# Patient Record
Sex: Male | Born: 1980 | Race: White | Hispanic: No | Marital: Single
Health system: Southern US, Community
[De-identification: ages and names within clinical notes are randomized; demographics above are authoritative.]

---

## 2016-08-21 ENCOUNTER — Ambulatory Visit (INDEPENDENT_AMBULATORY_CARE_PROVIDER_SITE_OTHER): Payer: Managed Care, Other (non HMO) | Admitting: Family

## 2016-08-21 DIAGNOSIS — M5442 Lumbago with sciatica, left side: Secondary | ICD-10-CM | POA: Diagnosis not present

## 2016-08-21 DIAGNOSIS — G8929 Other chronic pain: Secondary | ICD-10-CM

## 2016-08-21 DIAGNOSIS — M5441 Lumbago with sciatica, right side: Secondary | ICD-10-CM | POA: Diagnosis not present

## 2016-08-21 MED ORDER — PREDNISONE 10 MG PO TABS: ORAL_TABLET | ORAL | 0 refills | Status: DC

## 2016-08-22 NOTE — Progress Notes (Signed)
Office Visit Note   [name]           Date of Birth: Jan 21, 1981           MRN: [id number] Visit Date: 08/21/2016              Requested by: No referring provider defined for this encounter. PCP: No PCP Per Patient  No chief complaint on file.     HPI: The patient is a 36 year old gentleman seen today for evaluation of low back pain. This is been ongoing for 7 years intermittently. States has been worsening over the last 3 weeks. He has had constant radicular symptoms down the right side and at times his left lower extremity. States that when the pain initially started he simply sneezed while at rest has had issues of back pain ever since. Has been evaluated in multiple urgent cares and emergency department for the same. Most recently has seen a chiropractor who did x-rays states that the visit with the chiropractor was not helpful. Has used Aleve when necessary "just to be taking something" but this has not been helpful. Complains of right sided pain radiating all the way down to his foot today. Denies any numbness or tingling so seated with this. Has least pain when he is laying flat on his back. States that he also leans forward on his elbows to offload pressure on his spine.  Historical Radiographs reveal loss disc space l4 l5 and l5- s1. No spondylolisthesis.  Assessment & Plan: Visit Diagnoses:  1. Chronic bilateral low back pain with bilateral sciatica     Plan: Will trial a prednisone taper. Has had relief from these in the past. Will proceed with MRI lumbar spine to rule out HNP.   Follow-Up Instructions: Return for p mri.   Back Exam   Tenderness  The patient is experiencing no tenderness.   Muscle Strength  The patient has normal back strength.  Tests  Straight leg raise right: positive Straight leg raise left: negative      Patient is alert, oriented, no adenopathy, well-dressed, normal affect, normal respiratory effort. Uncomfortable on  exam.  Imaging: No results found.  Labs: No results found for: HGBA1C, ESRSEDRATE, CRP, LABURIC, REPTSTATUS, GRAMSTAIN, CULT, LABORGA  Orders:  Orders Placed This Encounter  Procedures  . MR LUMBAR SPINE WO CONTRAST   Meds ordered this encounter  Medications  . predniSONE (DELTASONE) 10 MG tablet    Sig: 6 tablets for 2 days, then 5 for 2 days, then 4 for 2 days, then 3  for 2 days, then 2 for 2 days, then 1 tablet for 2 days    Dispense:  42 tablet    Refill:  0     Procedures: No procedures performed  Clinical Data: No additional findings.  ROS:  All other systems negative, except as noted in the HPI. Review of Systems  Constitutional: Negative for chills and fever.  Gastrointestinal: Negative.   Genitourinary: Negative.   Musculoskeletal: Positive for back pain.  Neurological: Negative for weakness and numbness.    Objective: Vital Signs: There were no vitals taken for this visit.  Specialty Comments:  No specialty comments available.  PMFS History: There are no active problems to display for this patient.  No past medical history on file.  No family history on file.  No past surgical history on file. Social History   Occupational History  . Not on file.   Social History Main Topics  .  Smoking status: Not on file  . Smokeless tobacco: Not on file  . Alcohol use Not on file  . Drug use: Unknown  . Sexual activity: Not on file

## 2016-08-31 ENCOUNTER — Ambulatory Visit (HOSPITAL_BASED_OUTPATIENT_CLINIC_OR_DEPARTMENT_OTHER)
Admission: RE | Admit: 2016-08-31 | Discharge: 2016-08-31 | Disposition: A | Discharge status: Home / Self Care | Payer: Managed Care, Other (non HMO) | Source: Ambulatory Visit | Attending: Family | Admitting: Family

## 2016-08-31 DIAGNOSIS — G8929 Other chronic pain: Secondary | ICD-10-CM | POA: Insufficient documentation

## 2016-08-31 DIAGNOSIS — M5441 Lumbago with sciatica, right side: Secondary | ICD-10-CM | POA: Insufficient documentation

## 2016-08-31 DIAGNOSIS — M5442 Lumbago with sciatica, left side: Secondary | ICD-10-CM | POA: Insufficient documentation

## 2016-08-31 DIAGNOSIS — M48061 Spinal stenosis, lumbar region without neurogenic claudication: Secondary | ICD-10-CM | POA: Diagnosis not present

## 2016-08-31 DIAGNOSIS — M5126 Other intervertebral disc displacement, lumbar region: Secondary | ICD-10-CM | POA: Diagnosis not present

## 2016-09-02 ENCOUNTER — Other Ambulatory Visit (INDEPENDENT_AMBULATORY_CARE_PROVIDER_SITE_OTHER): Payer: Self-pay | Admitting: Family

## 2016-09-02 DIAGNOSIS — G8929 Other chronic pain: Secondary | ICD-10-CM

## 2016-09-02 DIAGNOSIS — M5442 Lumbago with sciatica, left side: Principal | ICD-10-CM

## 2016-09-03 ENCOUNTER — Telehealth (INDEPENDENT_AMBULATORY_CARE_PROVIDER_SITE_OTHER): Payer: Self-pay

## 2016-09-03 ENCOUNTER — Ambulatory Visit (INDEPENDENT_AMBULATORY_CARE_PROVIDER_SITE_OTHER): Payer: Managed Care, Other (non HMO) | Admitting: Orthopedic Surgery

## 2016-09-03 ENCOUNTER — Other Ambulatory Visit (INDEPENDENT_AMBULATORY_CARE_PROVIDER_SITE_OTHER): Payer: Self-pay | Admitting: Family

## 2016-09-03 MED ORDER — PREDNISONE 10 MG PO TABS: ORAL_TABLET | ORAL | 0 refills | Status: AC

## 2016-09-03 NOTE — Telephone Encounter (Signed)
[facility] wish to refill 

## 2016-09-03 NOTE — Telephone Encounter (Signed)
Prescription refilled for the prednisone. Follow-up with Dr. Lajoyce Corners for evaluation for epidural steroid injection.

## 2016-09-03 NOTE — Telephone Encounter (Signed)
Pt called again to ask if this can be filled today due to him going out of town tomorrow (Prednisone)

## 2016-09-03 NOTE — Telephone Encounter (Signed)
Called pt to schedule injection with Dr. Alvester Morin. Scheduled him for 09/12/16 (1st available). Canc his appt today with Dr. Lajoyce Corners for the MRI review like we discussed and then he asked about pain med. Says he will finish the prednisone tomorrow which has been helping and wanted to know if this could be refilled or if he could get something else to help him until he is able to be seen next week? Uses [facility] [address] [facility]

## 2016-09-03 NOTE — Telephone Encounter (Signed)
Please see message below and advise. Ok to refill prednisone or something else for pain.

## 2016-09-04 NOTE — Telephone Encounter (Signed)
I called and advised pt

## 2016-09-12 ENCOUNTER — Encounter (INDEPENDENT_AMBULATORY_CARE_PROVIDER_SITE_OTHER): Payer: Managed Care, Other (non HMO) | Admitting: Physical Medicine and Rehabilitation

## 2016-09-16 ENCOUNTER — Ambulatory Visit (INDEPENDENT_AMBULATORY_CARE_PROVIDER_SITE_OTHER): Payer: Managed Care, Other (non HMO) | Admitting: Physical Medicine and Rehabilitation

## 2016-09-16 ENCOUNTER — Ambulatory Visit (INDEPENDENT_AMBULATORY_CARE_PROVIDER_SITE_OTHER): Payer: Self-pay

## 2016-09-16 VITALS — BP 130/84 | HR 97

## 2016-09-16 DIAGNOSIS — M5416 Radiculopathy, lumbar region: Secondary | ICD-10-CM | POA: Diagnosis not present

## 2016-09-16 DIAGNOSIS — M5116 Intervertebral disc disorders with radiculopathy, lumbar region: Secondary | ICD-10-CM

## 2016-09-16 MED ORDER — IIOPAMIDOL (ISOVUE-250) INJECTION 51%
3.0000 mL | Freq: Once | INTRAVENOUS | Status: AC
  Administered 2016-09-16: 3 mL
  Filled 2016-09-16: qty 50

## 2016-09-16 MED ORDER — METHYLPREDNISOLONE ACETATE 80 MG/ML IJ SUSP
80.0000 mg | Freq: Once | INTRAMUSCULAR | Status: AC
  Administered 2016-09-16: 80 mg

## 2016-09-16 MED ORDER — LIDOCAINE HCL (PF) 1 % IJ SOLN
2.0000 mL | Freq: Once | INTRAMUSCULAR | Status: AC
  Administered 2016-09-16: 2 mL

## 2016-09-16 NOTE — Progress Notes (Deleted)
Pain center of low back. Shooting pain down both legs at times. Not related to any certain activity. Denies numbness, tingling, or weakness.

## 2016-09-16 NOTE — Progress Notes (Signed)
Tyler Mcdonald - 36 y.o. male MRN [id number]  Date of birth: 1981-02-11  Office Visit Note: Visit Date: 09/16/2016 PCP: Patient, No Pcp Per Referred by: Adonis Huguenin, NP  Subjective: Chief Complaint  Patient presents with  . Lower Back - Pain   HPI: [name] is a very pleasant 36 year old gentleman who has been suffering from on off intermittent flareups of low back pain and radicular-type hip and thigh pain. He has a MRI of the lumbar spine showing broad-based central protrusion at L4-5 with left more than right lateral recess narrowing. There is no focal nerve compression. He has had conservative care for many years but has not had interventional injection. This particular episode is been pretty problematic and painful and this is not going away. He first had symptoms probably 7 or 8 years ago. He said he had a sneeze at that point and has had really severe low back pain off and on since that time in episodes. He denies any focal weakness.    ROS Otherwise per HPI.  Assessment & Plan: Visit Diagnoses:  1. Lumbar radiculopathy   2. Radiculopathy due to lumbar intervertebral disc disorder     Plan: Findings:  Bilateral L5 transforaminal injections to try to get steroid medication along the lateral recess and right at the L4-5 disc interface anteriorly. This would hope would be diagnostic and therapeutic. If he is having more back pain chronically he should still focus on core strengthening and endurance with flexibility and mobility exercises for his hip and hamstrings. Also the L5 vertebral bodies seem to be partially sacralized particularly on the left side.    Meds & Orders:  Meds ordered this encounter  Medications  . lidocaine (PF) (XYLOCAINE) 1 % injection 2 mL  . iopamidol (ISOVUE-250) 51 % injection 3 mL  . methylPREDNISolone acetate (DEPO-MEDROL) injection 80 mg    Orders Placed This Encounter  Procedures  . XR C-ARM NO REPORT  . Epidural Steroid injection      Follow-up: Return if symptoms worsen or fail to improve.   Procedures: No procedures performed  Lumbosacral Transforaminal Epidural Steroid Injection - Infraneural Approach with Fluoroscopic Guidance  [name]      Date of Birth: 02/23/1981 MRN: [id number] PCP: Patient, No Pcp Per      Visit Date: 09/16/2016   Universal Protocol:    Date/Time: 05/08/186:05 AM  Consent Given By: the patient  Position: PRONE   Additional Comments: Vital signs were monitored before and after the procedure. Patient was prepped and draped in the usual sterile fashion. The correct patient, procedure, and site was verified.   Injection Procedure Details:  Procedure Site One Meds Administered:  Meds ordered this encounter  Medications  . lidocaine (PF) (XYLOCAINE) 1 % injection 2 mL  . iopamidol (ISOVUE-250) 51 % injection 3 mL  . methylPREDNISolone acetate (DEPO-MEDROL) injection 80 mg      Laterality: Bilateral  Location/Site:  L5-S1  Needle size: 22 G  Needle type: Spinal  Needle Placement: Transforaminal  Findings:  -Contrast Used: 1 mL iohexol 180 mg iodine/mL   -Comments: Excellent flow of contrast along the nerve and into the epidural space.  Procedure Details: After squaring off the end-plates of the desired vertebral level to get a true AP view, the C-arm was obliqued to the painful side so that the superior articulating process is positioned about 1/3 the length of the inferior endplate.  The needle was aimed toward the junction of the superior articular process  and the transverse process of the inferior vertebrae. The needle's initial entry is in the lower third of the foramen through Kambin's triangle. The soft tissues overlying this target were infiltrated with 2-3 ml. of 1% Lidocaine without Epinephrine.  The spinal needle was then inserted and advanced toward the target using a "trajectory" view along the fluoroscope beam.  Under AP and lateral visualization,  the needle was advanced so it did not puncture dura and did not traverse medially beyond the 6 o'clock position of the pedicle. Bi-planar projections were used to confirm position. Aspiration was confirmed to be negative for CSF and/or blood. A 1-2 ml. volume of Isovue-250 was injected and flow of contrast was noted at each level. Radiographs were obtained for documentation purposes.   After attaining the desired flow of contrast documented above, a 0.5 to 1.0 ml test dose of 0.25% Marcaine was injected into each respective transforaminal space.  The patient was observed for 90 seconds post injection.  After no sensory deficits were reported, and normal lower extremity motor function was noted,   the above injectate was administered so that equal amounts of the injectate were placed at each foramen (level) into the transforaminal epidural space.   Additional Comments:  The patient tolerated the procedure well Dressing: Band-Aid    Post-procedure details: Patient was observed during the procedure. Post-procedure instructions were reviewed.  Patient left the clinic in stable condition.   Clinical History: MRI LUMBAR SPINE WITHOUT CONTRAST  TECHNIQUE: Multiplanar, multisequence MR imaging of the lumbar spine was performed. No intravenous contrast was administered.  COMPARISON:  None.  FINDINGS: Segmentation:  Standard.  Alignment:  Maintained.  Vertebrae: Height is maintained. Mild degenerative endplate signal change L4-5 is noted.  Conus medullaris: Extends to the T12 level and appears normal.  Paraspinal and other soft tissues: Negative.  Disc levels:  T12-L1 is imaged in the sagittal plane only and negative.  L1-2:  Negative.  L2-3: Mild disc desiccation and a slight bulge without central canal or foraminal stenosis.  L3-4: Minimal disc bulge. The central canal and foramina are widely patent.  L4-5: Broad-based central protrusion slightly indents the  ventral thecal sac. The disc causes left worse than right subarticular recess narrowing which could impact either descending L5 root. The foramina are open.  L5-S1: Negative.  IMPRESSION: Broad-based central disc protrusion at L4-5 causes left worse than right subarticular recess narrowing which could impact either descending L5 root. The disc mildly indents the ventral thecal sac.  Mild disc desiccation and a slight bulge L2-3 without central canal or foraminal stenosis.   Electronically Signed   By: Drusilla Kanner M.D.   On: 08/31/2016 09:42  He has no tobacco history on file. No results for input(s): HGBA1C, LABURIC in the last 8760 hours.  Objective:  VS:  HT:    WT:   BMI:     BP:130/84  HR:97bpm  TEMP: ( )  RESP:99 % Physical Exam  Musculoskeletal:  Patient ambulates with a slightly forward flexed spine he has pain with extension. He has good distal strength.    Ortho Exam Imaging: Xr C-arm No Report  Result Date: 09/16/2016 Please see Notes or Procedures tab for imaging impression.   Past Medical/Family/Surgical/Social History: Medications & Allergies reviewed per EMR There are no active problems to display for this patient.  No past medical history on file. No family history on file. No past surgical history on file. Social History   Occupational History  . Not on file.  Social History Main Topics  . Smoking status: Not on file  . Smokeless tobacco: Not on file  . Alcohol use Not on file  . Drug use: Unknown  . Sexual activity: Not on file

## 2016-09-16 NOTE — Patient Instructions (Signed)

## 2016-09-17 NOTE — Procedures (Signed)
Lumbosacral Transforaminal Epidural Steroid Injection - Infraneural Approach with Fluoroscopic Guidance  Patient: Tyler GlassingBryan Downs      Date of Birth: 11/01/1980 MRN: [id number] PCP: Patient, No Pcp Per      Visit Date: 09/16/2016   Universal Protocol:    Date/Time: 05/08/186:05 AM  Consent Given By: the patient  Position: PRONE   Additional Comments: Vital signs were monitored before and after the procedure. Patient was prepped and draped in the usual sterile fashion. The correct patient, procedure, and site was verified.   Injection Procedure Details:  Procedure Site One Meds Administered:  Meds ordered this encounter  Medications  . lidocaine (PF) (XYLOCAINE) 1 % injection 2 mL  . iopamidol (ISOVUE-250) 51 % injection 3 mL  . methylPREDNISolone acetate (DEPO-MEDROL) injection 80 mg      Laterality: Bilateral  Location/Site:  L5-S1  Needle size: 22 G  Needle type: Spinal  Needle Placement: Transforaminal  Findings:  -Contrast Used: 1 mL iohexol 180 mg iodine/mL   -Comments: Excellent flow of contrast along the nerve and into the epidural space.  Procedure Details: After squaring off the end-plates of the desired vertebral level to get a true AP view, the C-arm was obliqued to the painful side so that the superior articulating process is positioned about 1/3 the length of the inferior endplate.  The needle was aimed toward the junction of the superior articular process and the transverse process of the inferior vertebrae. The needle's initial entry is in the lower third of the foramen through Kambin's triangle. The soft tissues overlying this target were infiltrated with 2-3 ml. of 1% Lidocaine without Epinephrine.  The spinal needle was then inserted and advanced toward the target using a "trajectory" view along the fluoroscope beam.  Under AP and lateral visualization, the needle was advanced so it did not puncture dura and did not traverse medially beyond the 6 o'clock  position of the pedicle. Bi-planar projections were used to confirm position. Aspiration was confirmed to be negative for CSF and/or blood. A 1-2 ml. volume of Isovue-250 was injected and flow of contrast was noted at each level. Radiographs were obtained for documentation purposes.   After attaining the desired flow of contrast documented above, a 0.5 to 1.0 ml test dose of 0.25% Marcaine was injected into each respective transforaminal space.  The patient was observed for 90 seconds post injection.  After no sensory deficits were reported, and normal lower extremity motor function was noted,   the above injectate was administered so that equal amounts of the injectate were placed at each foramen (level) into the transforaminal epidural space.   Additional Comments:  The patient tolerated the procedure well Dressing: Band-Aid    Post-procedure details: Patient was observed during the procedure. Post-procedure instructions were reviewed.  Patient left the clinic in stable condition.

## 2016-12-31 ENCOUNTER — Telehealth (INDEPENDENT_AMBULATORY_CARE_PROVIDER_SITE_OTHER): Payer: Self-pay | Admitting: Orthopedic Surgery

## 2016-12-31 NOTE — Telephone Encounter (Signed)
Returned call to patient left message to call back. 

## 2017-01-01 ENCOUNTER — Ambulatory Visit (INDEPENDENT_AMBULATORY_CARE_PROVIDER_SITE_OTHER): Payer: 59 | Admitting: Orthopaedic Surgery

## 2017-01-01 VITALS — Ht 74.0 in | Wt 218.0 lb

## 2017-01-01 DIAGNOSIS — M5441 Lumbago with sciatica, right side: Secondary | ICD-10-CM | POA: Diagnosis not present

## 2017-01-01 MED ORDER — TIZANIDINE HCL 4 MG PO TABS: 4.0000 mg | ORAL_TABLET | Freq: Three times a day (TID) | ORAL | 0 refills | Status: AC | PRN

## 2017-01-01 MED ORDER — HYDROCODONE-ACETAMINOPHEN 5-325 MG PO TABS: 1.0000 | ORAL_TABLET | Freq: Four times a day (QID) | ORAL | 0 refills | Status: AC | PRN

## 2017-01-01 MED ORDER — METHYLPREDNISOLONE 4 MG PO TABS: ORAL_TABLET | ORAL | 0 refills | Status: DC

## 2017-01-01 NOTE — Progress Notes (Signed)
Office Visit Note   [name]           Date of Birth: March 04, 1981           MRN: [id number] Visit Date: 01/01/2017              Requested by: No referring provider defined for this encounter. PCP: Patient, No Pcp Per   Assessment & Plan: Visit Diagnoses:  1. Acute right-sided low back pain with right-sided sciatica     Plan: Given his clinical exam findings and the severity of his pain an acute MRI is warranted to rule out herniated disc. I'm little put him on a six-day steroid taper as well as some hydrocodone and Zanaflex. Were I also going to give him an intramuscular Toradol injection here in the office today due to the severity of his pain and discomfort. I've counseled him that if he develops any acute bowel or bladder function changes that she go to the emergency room. We'll work on obtaining an MRI. All the medications of incision and as well.  Follow-Up Instructions: No Follow-up on file.   Orders:  Orders Placed This Encounter  Procedures  . MR Lumbar Spine w/o contrast   Meds ordered this encounter  Medications  . methylPREDNISolone (MEDROL) 4 MG tablet    Sig: Medrol dose pack. Take as instructed    Dispense:  21 tablet    Refill:  0  . tiZANidine (ZANAFLEX) 4 MG tablet    Sig: Take 1 tablet (4 mg total) by mouth every 8 (eight) hours as needed for muscle spasms.    Dispense:  60 tablet    Refill:  0  . HYDROcodone-acetaminophen (NORCO/VICODIN) 5-325 MG tablet    Sig: Take 1-2 tablets by mouth every 6 (six) hours as needed for moderate pain.    Dispense:  60 tablet    Refill:  0      Procedures: No procedures performed   Clinical Data: No additional findings.   Subjective: Chief Complaint  Patient presents with  . Lower Back - Pain  The patient is someone I'm seeing for the first time in the office but is an established patient of this office. He comes in his acute work in due to severe low back pain and radicular symptoms going down  his right leg. He is actually had injections in his back before by Dr. Alvester Morin and his had a known rod based central disc protrusion at L4-L5. Yesterday he was in the shower he coughed real hard and he got severe pain rating down his leg. He is now barely able to stand due to the severity of his pain. He exam [name] with a cane and he has his [name] with him as well to help him get around. He denies any change in bowel or bladder function or any saddle anesthesia but he does report weakness in his right lower extremity. He is not on any type of medications other than ibuprofen.  HPI  Review of Systems He currently denies any headache, chest pain, shortness of breath, fever, chills, nausea, vomiting.  Objective: Vital Signs: Ht 6\' 2"  (1.88 m)   Wt 218 lb (98.9 kg)   BMI 27.99 kg/m   Physical Exam He is alert and oriented 3 and in no acute distress but obvious discomfort. Ortho Exam He is walking hunched over and barely able to stand. He has profoundly positive straight leg raise bilaterally. He has subjective numbness in the L4-L5 distribution going on  his right leg. He does have just some slight weakness in dorsiflexion of the right foot. Specialty Comments:  No specialty comments available.  Imaging: No results found.   PMFS History: There are no active problems to display for this patient.  No past medical history on file.  No family history on file.  No past surgical history on file. Social History   Occupational History  . Not on file.   Social History Main Topics  . Smoking status: Not on file  . Smokeless tobacco: Not on file  . Alcohol use Not on file  . Drug use: Unknown  . Sexual activity: Not on file

## 2017-01-04 ENCOUNTER — Ambulatory Visit (HOSPITAL_BASED_OUTPATIENT_CLINIC_OR_DEPARTMENT_OTHER)
Admission: RE | Admit: 2017-01-04 | Discharge: 2017-01-04 | Disposition: A | Discharge status: Home / Self Care | Payer: 59 | Source: Ambulatory Visit | Attending: Orthopaedic Surgery | Admitting: Orthopaedic Surgery

## 2017-01-04 DIAGNOSIS — M5441 Lumbago with sciatica, right side: Secondary | ICD-10-CM | POA: Diagnosis not present

## 2017-01-04 DIAGNOSIS — M5136 Other intervertebral disc degeneration, lumbar region: Secondary | ICD-10-CM | POA: Insufficient documentation

## 2017-01-04 DIAGNOSIS — M5126 Other intervertebral disc displacement, lumbar region: Secondary | ICD-10-CM | POA: Insufficient documentation

## 2017-01-06 ENCOUNTER — Telehealth (INDEPENDENT_AMBULATORY_CARE_PROVIDER_SITE_OTHER): Payer: Self-pay | Admitting: Radiology

## 2017-01-06 ENCOUNTER — Ambulatory Visit (INDEPENDENT_AMBULATORY_CARE_PROVIDER_SITE_OTHER): Payer: 59 | Admitting: Orthopaedic Surgery

## 2017-01-06 DIAGNOSIS — M5126 Other intervertebral disc displacement, lumbar region: Secondary | ICD-10-CM | POA: Diagnosis not present

## 2017-01-06 NOTE — Addendum Note (Signed)
Addended by: Rogers Seeds on: 01/06/2017 01:32 PM   Modules accepted: Orders

## 2017-01-06 NOTE — Telephone Encounter (Signed)
Yes.  I saw the MRI and it does show progression of a herniated disc.

## 2017-01-06 NOTE — Telephone Encounter (Signed)
I sent in to [employer] for auth for this patient's MRI.  They denied it, and I sent it for reconsideration.  They denied it again.  Patient went ahead and paid for and had the MRI done on Saturday.  Their rationale for denial was that there had not been 7 days conservative treatment this episode.  Patient was still in severe pain after 3 days of the MDP. I will call them to see if we can appeal, and make this auth retroactive.  Tyler Mcdonald I arrange a peer to peer this week for you to speak with them?   Case #[id number] Ph [id number]

## 2017-01-06 NOTE — Progress Notes (Signed)
The patient is here for follow-up after MRI of his lumbar spine. We felt this MRI was medically necessary due to a significant worsening of her deck her symptoms from unknown him for herniated disc. He had already had an epidural steroid injection in May. He came in last week with severe radicular symptoms going down his leg. We tried a steroid taper as well as activity modification, pain medications and a muscle relaxant. We recommended the MRI due to his clinical exam as well showing numbness and tingling, weakness and decreased reflexes.  On exam he is still having a considerable amount of pain ambulating. He is radicular symptoms going down his right leg but he has a positive straight leg right and left sided. He is subjective numbness in his right foot as well as some weakness with dorsiflexion.  The MRI is reviewed and compared to an MRI from earlier this year. It does show an increase in the herniation of an L4-L5 disc that is close to contacting the right S1 nerve root and bilateral L5 nerves.  Given the worsening of his symptoms and the failure of conservative treatment I like to refer him to one of my neurosurgery colleagues in town for further rotation treatment and considering a potential discectomy. The patient is in favor of this referral as well.

## 2017-01-06 NOTE — Telephone Encounter (Signed)
Peer to peer arranged, Wed 915 am 01/08/17.  I will direct to Dr Magnus Ivan when they call.

## 2017-01-08 ENCOUNTER — Telehealth (INDEPENDENT_AMBULATORY_CARE_PROVIDER_SITE_OTHER): Payer: Self-pay

## 2017-01-08 ENCOUNTER — Other Ambulatory Visit (INDEPENDENT_AMBULATORY_CARE_PROVIDER_SITE_OTHER): Payer: Self-pay

## 2017-01-08 MED ORDER — METHYLPREDNISOLONE 4 MG PO TABS: ORAL_TABLET | ORAL | 0 refills | Status: AC

## 2017-01-08 NOTE — Telephone Encounter (Signed)
Sent to pharmacy 

## 2017-01-08 NOTE — Telephone Encounter (Signed)
It's okay to try one more six-day steroid taper like we did just recently. Please send that in.

## 2017-01-08 NOTE — Telephone Encounter (Signed)
Please advise 

## 2017-01-08 NOTE — Telephone Encounter (Signed)
Patient would like to know if Dr. Magnus IvanBlackman would prescribe another Rx for Prednisone.  Stated that his back went out again this morning.  CB# is [phone].  Please advise.  Thank You.

## 2017-01-09 NOTE — Telephone Encounter (Signed)
Berkley Harveyauth [id number] [facility]

## 2017-01-09 NOTE — Telephone Encounter (Signed)
Upon speaking with the Dr for peer to peer we were advised we needed to set up a retrospective review instead.  This is set up for today 130 pm with Dr Payton DoughtyJulia Tiernan.

## 2017-12-22 IMAGING — MR MR LUMBAR SPINE W/O CM
4 of 5 series · 25 of 48 positions shown · non-contrast
Comparison: None.

CLINICAL DATA: Chronic low back pain radiating into both legs. No
known injury.

EXAM:
MRI LUMBAR SPINE WITHOUT CONTRAST
TECHNIQUE: Multiplanar, multisequence MR imaging of the lumbar spine was
performed. No intravenous contrast was administered.

[Series 3: T1 · sagittal · 4.0mm · 0.51mm/px · 6 of 13 slices shown (1 of 2)]
[im 1/13]
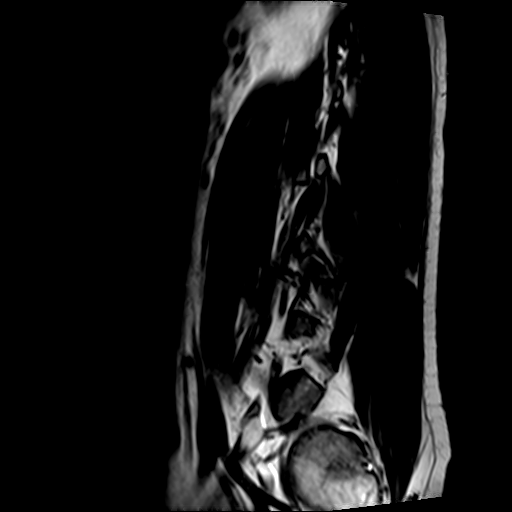
[im 3/13]
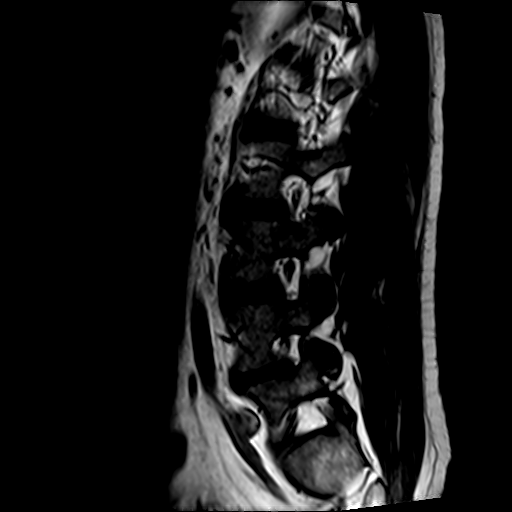
[im 5/13]
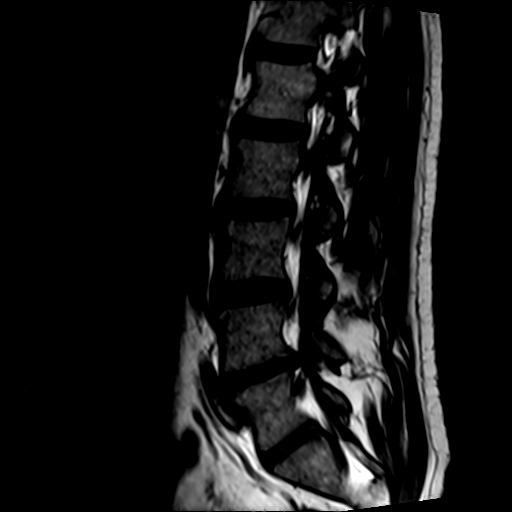
[im 8/13]
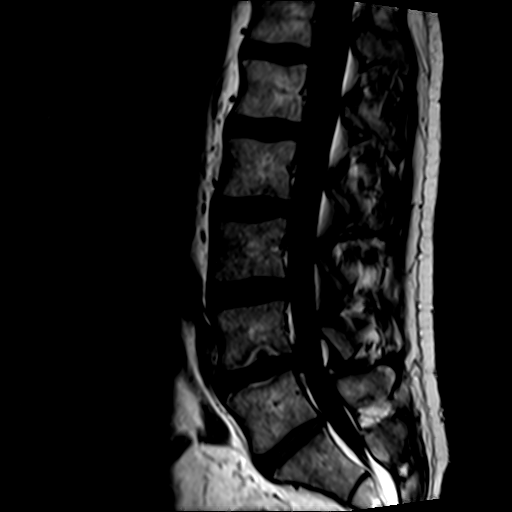
[im 10/13]
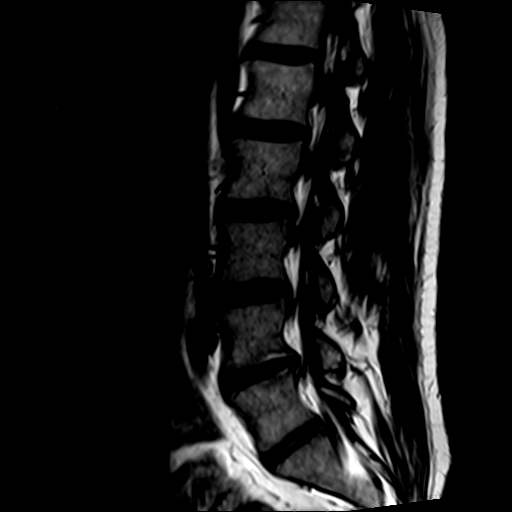
[im 13/13]
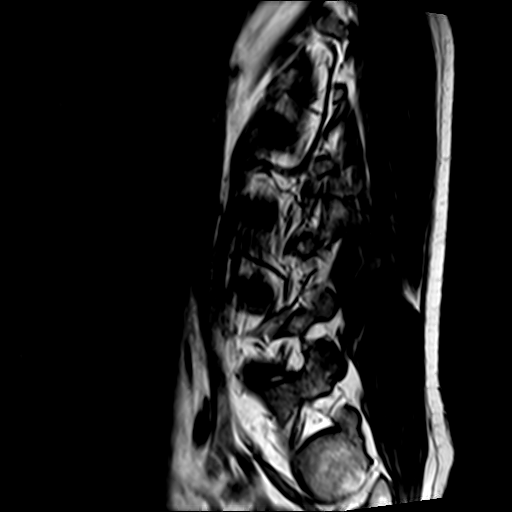

[Series 4: T2 · sagittal · 4.0mm · 0.81mm/px · 5 of 13 slices shown (1 of 2)]
[im 1/13]
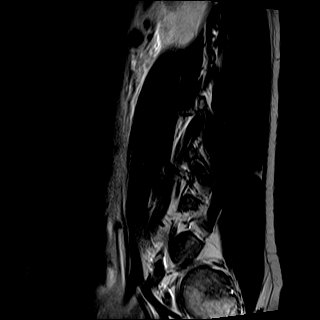
[im 4/13]
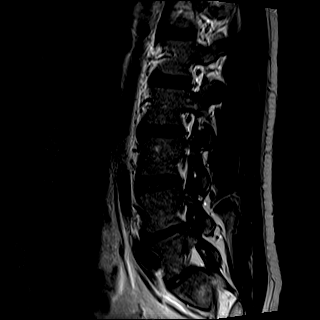
[im 7/13]
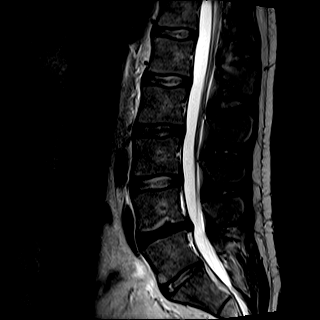
[im 10/13]
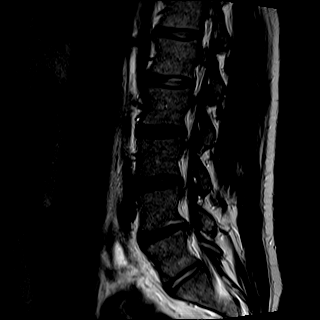
[im 13/13]
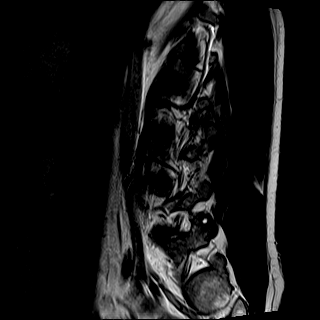

[Series 6: T2 · axial · 4.0mm · 0.39mm/px · z∈[-142,+73]mm · 10 of 39 slices shown (2 of 2)]
[im 3/39]
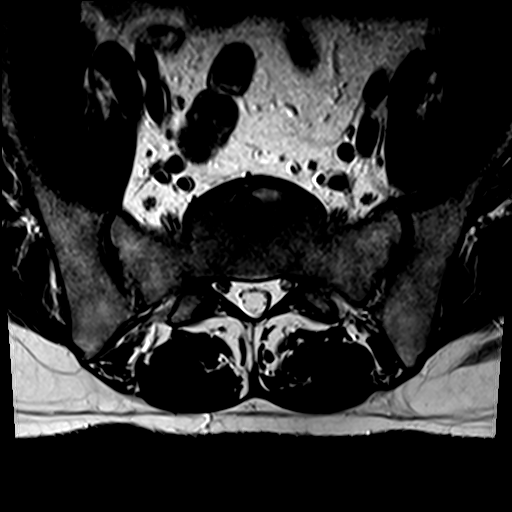
[im 6/39]
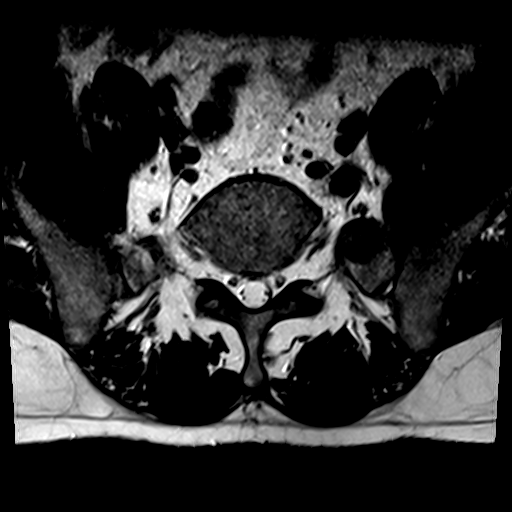
[im 8/39]
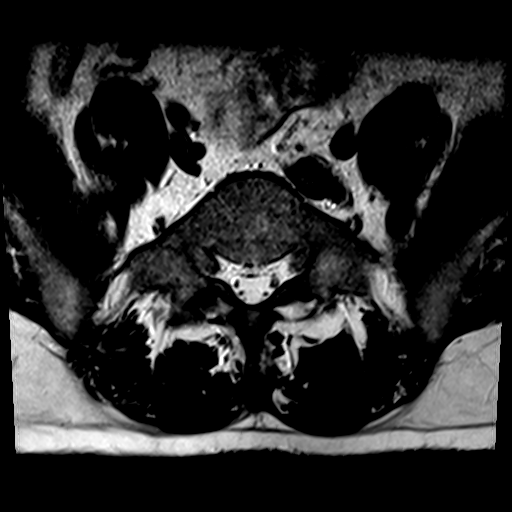
[im 13/39]
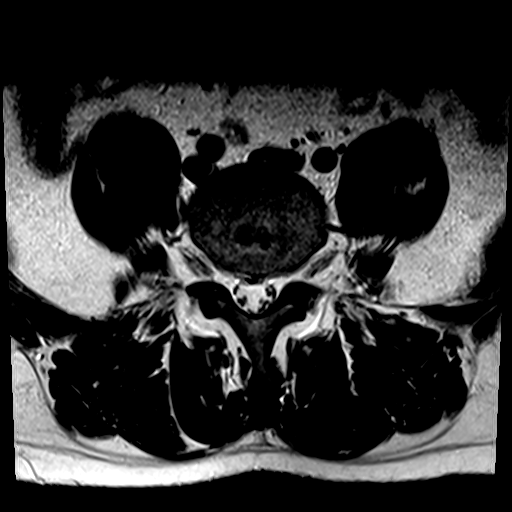
[im 18/39]
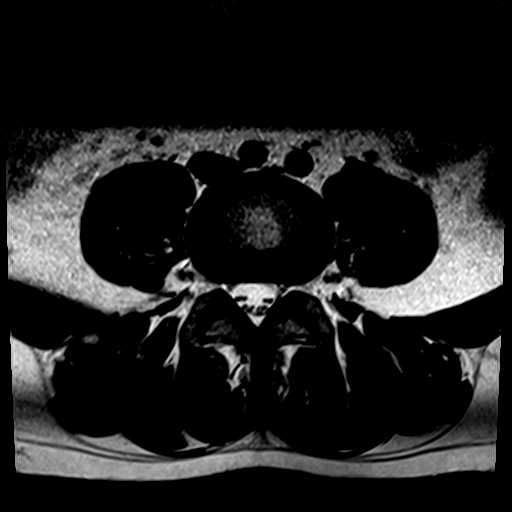
[im 21/39]
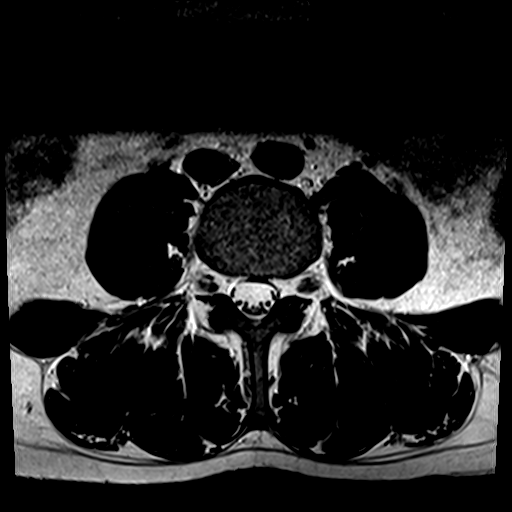
[im 23/39]
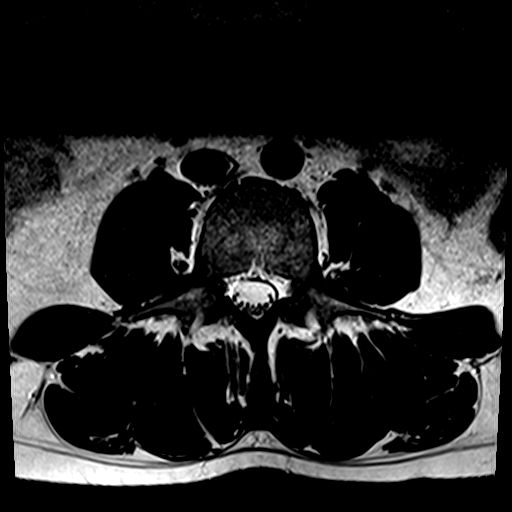
[im 28/39]
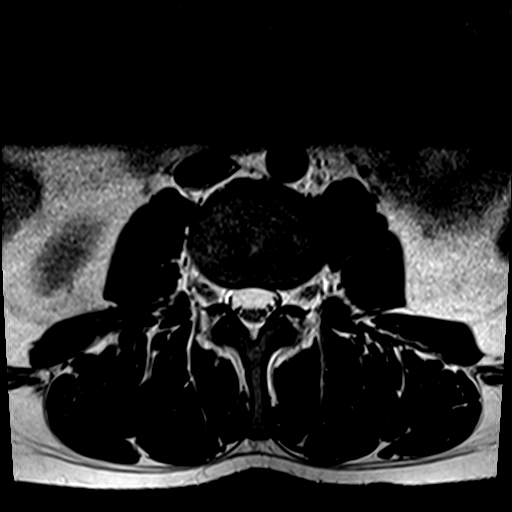
[im 33/39]
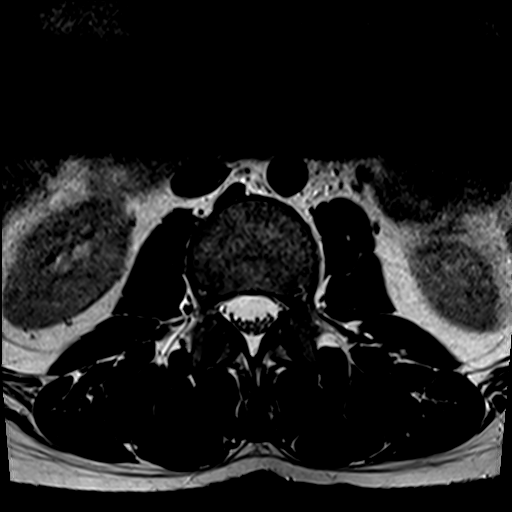
[im 39/39]
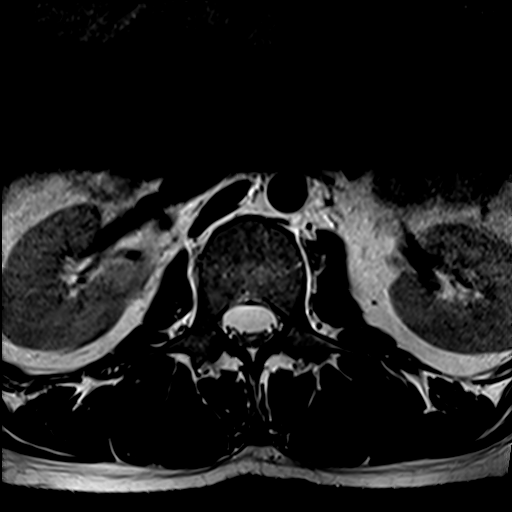

[Series 7: T1 · axial · 4.0mm · 0.78mm/px · z∈[-142,+43]mm · 4 of 39 slices shown (2 of 2)]
[im 3/39]
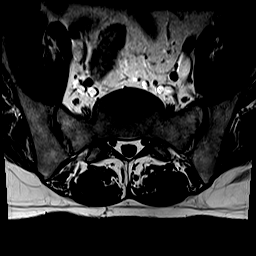
[im 6/39]
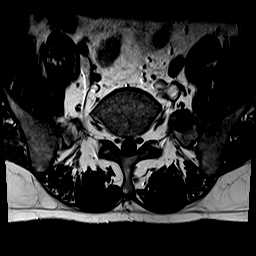
[im 21/39]
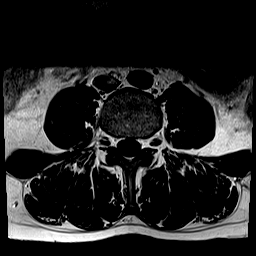
[im 33/39]
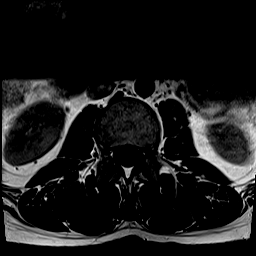

[25 of 48 positions shown; findings below may reference images not displayed]

FINDINGS: Segmentation:  Standard.

Alignment:  Maintained.

Vertebrae: Height is maintained. Mild degenerative endplate signal
change L4-5 is noted.

Conus medullaris: Extends to the T12 level and appears normal.

Paraspinal and other soft tissues: Negative.

Disc levels:

T12-L1 is imaged in the sagittal plane only and negative.

L1-2:  Negative.

L2-3: Mild disc desiccation and a slight bulge without central canal
or foraminal stenosis.

L3-4: Minimal disc bulge. The central canal and foramina are widely
patent.

L4-5: Broad-based central protrusion slightly indents the ventral
thecal sac. The disc causes left worse than right subarticular
recess narrowing which could impact either descending L5 root. The
foramina are open.

L5-S1: Negative.
IMPRESSION: Broad-based central disc protrusion at L4-5 causes left worse than
right subarticular recess narrowing which could impact either
descending L5 root. The disc mildly indents the ventral thecal sac.

Mild disc desiccation and a slight bulge L2-3 without central canal
or foraminal stenosis.

## 2023-01-06 ENCOUNTER — Encounter (HOSPITAL_BASED_OUTPATIENT_CLINIC_OR_DEPARTMENT_OTHER): Payer: Self-pay | Admitting: Neurological Surgery

## 2023-01-08 ENCOUNTER — Other Ambulatory Visit (HOSPITAL_BASED_OUTPATIENT_CLINIC_OR_DEPARTMENT_OTHER): Payer: Self-pay | Admitting: Neurological Surgery

## 2023-01-08 DIAGNOSIS — M5416 Radiculopathy, lumbar region: Secondary | ICD-10-CM

## 2023-01-09 ENCOUNTER — Telehealth (HOSPITAL_BASED_OUTPATIENT_CLINIC_OR_DEPARTMENT_OTHER): Payer: Self-pay
